# Patient Record
Sex: Male | Born: 1960 | Race: Black or African American | Hispanic: No | Marital: Single | Smoking: Never smoker
Health system: Southern US, Community
[De-identification: ages and names within clinical notes are randomized; demographics above are authoritative.]

## PROBLEM LIST (undated history)

## (undated) DIAGNOSIS — G629 Polyneuropathy, unspecified: Secondary | ICD-10-CM

## (undated) DIAGNOSIS — I10 Essential (primary) hypertension: Secondary | ICD-10-CM

---

## 2016-05-10 DIAGNOSIS — G4733 Obstructive sleep apnea (adult) (pediatric): Secondary | ICD-10-CM | POA: Insufficient documentation

## 2016-05-11 ENCOUNTER — Emergency Department (HOSPITAL_BASED_OUTPATIENT_CLINIC_OR_DEPARTMENT_OTHER)
Admission: EM | Admit: 2016-05-11 | Discharge: 2016-05-11 | Disposition: A | Discharge status: Home / Self Care | Payer: Self-pay | Attending: Emergency Medicine | Admitting: Emergency Medicine

## 2016-05-11 ENCOUNTER — Emergency Department (HOSPITAL_BASED_OUTPATIENT_CLINIC_OR_DEPARTMENT_OTHER): Payer: Self-pay

## 2016-05-11 ENCOUNTER — Encounter (HOSPITAL_BASED_OUTPATIENT_CLINIC_OR_DEPARTMENT_OTHER): Payer: Self-pay | Admitting: Emergency Medicine

## 2016-05-11 DIAGNOSIS — G4733 Obstructive sleep apnea (adult) (pediatric): Secondary | ICD-10-CM

## 2016-05-11 NOTE — ED Provider Notes (Signed)
[facility] Provider Note   CSN: [id number] Arrival date & time: 05/10/16  2353     History   Chief Complaint Chief Complaint  Patient presents with  . Shortness of Breath    HPI Gerald Simmons is a 55 y.o. male no sig PMH. Here with worsening SOB over the past several years that has gotten worse.  Patient states it is worse at night and esp when laying flat on his back.  His partner states he will often jump in bed. He states he will wake up in the middle of the night gasping for air.  In seconds, he will be back to normal.  Now even if he lays on his side, the same thing happens. He denies any pain during those times.  During the day, patient is very tired and does not feel well rested.  He has not seen a PCP for this. There are no further complaints.  10 Systems reviewed and are negative for acute change except as noted in the HPI.   HPI  History reviewed. No pertinent past medical history.  There are no active problems to display for this patient.   History reviewed. No pertinent surgical history.     Home Medications    Prior to Admission medications   Not on File    Family History History reviewed. No pertinent family history.  Social History Social History  Substance Use Topics  . Smoking status: Never Smoker  . Smokeless tobacco: Never Used  . Alcohol use Yes     Comment: occ     Allergies   Review of patient's allergies indicates no known allergies.   Review of Systems Review of Systems   Physical Exam Updated Vital Signs BP 172/74 (BP Location: Right Arm)   Pulse 62   Temp 98.2 F (36.8 C) (Oral)   Resp 18   Ht 5\' 6"  (1.676 m)   Wt 182 lb (82.6 kg)   SpO2 100%   BMI 29.38 kg/m   Physical Exam  Constitutional: He is oriented to person, place, and time. Vital signs are normal. He appears well-developed and well-nourished.  Non-toxic appearance. He does not appear ill. No distress.  HENT:  Head: Normocephalic and atraumatic.    Nose: Nose normal.  Mouth/Throat: Oropharynx is clear and moist. No oropharyngeal exudate.  Eyes: Conjunctivae and EOM are normal. Pupils are equal, round, and reactive to light. No scleral icterus.  Neck: Normal range of motion. Neck supple. No tracheal deviation, no edema, no erythema and normal range of motion present. No thyroid mass and no thyromegaly present.  Cardiovascular: Normal rate, regular rhythm, S1 normal, S2 normal, normal heart sounds, intact distal pulses and normal pulses.  Exam reveals no gallop and no friction rub.   No murmur heard. Pulmonary/Chest: Effort normal and breath sounds normal. No respiratory distress. He has no wheezes. He has no rhonchi. He has no rales.  Abdominal: Soft. Normal appearance and bowel sounds are normal. He exhibits no distension, no ascites and no mass. There is no hepatosplenomegaly. There is no tenderness. There is no rebound, no guarding and no CVA tenderness.  Musculoskeletal: Normal range of motion. He exhibits no edema or tenderness.  Lymphadenopathy:    He has no cervical adenopathy.  Neurological: He is alert and oriented to person, place, and time. He has normal strength. No cranial nerve deficit or sensory deficit.  Skin: Skin is warm, dry and intact. No petechiae and no rash noted. He is not diaphoretic.  No erythema. No pallor.  Nursing note and vitals reviewed.    ED Treatments / Results  Labs (all labs ordered are listed, but only abnormal results are displayed) Labs Reviewed - No data to display  EKG  EKG Interpretation  Date/Time:  Sunday May 11 2016 03:13:06 EDT Ventricular Rate:  58 PR Interval:    QRS Duration: 83 QT Interval:  416 QTC Calculation: 409 R Axis:   43 Text Interpretation:  Sinus rhythm RSR' in V1 or V2, probably normal variant No old tracing to compare Confirmed by Erroll Luna [phone]) on 05/11/2016 3:21:19 AM       Radiology Dg Chest 2 View  Result Date: 05/11/2016 CLINICAL DATA:   Shortness of breath. Chronic gasping for air while sleeping at night. Acutely worse over 2 days. Palpitations. Previous smoker. EXAM: CHEST  2 VIEW COMPARISON:  None. FINDINGS: The heart size and mediastinal contours are within normal limits. Both lungs are clear. The visualized skeletal structures are unremarkable. IMPRESSION: No active cardiopulmonary disease. Electronically Signed   By: Burman Nieves M.D.   On: 05/11/2016 03:32    Procedures Procedures (including critical care time)  Medications Ordered in ED Medications - No data to display   Initial Impression / Assessment and Plan / ED Course  I have reviewed the triage vital signs and the nursing notes.  Pertinent labs & imaging results that were available during my care of the patient were reviewed by me and considered in my medical decision making (see chart for details).  Clinical Course    Patient presents to the ED for SOB while sleeping. History is most consistent with OSA.  Will obtain EKG and CXR in the ED.  Education provided as well as need for PCP fu and outpt sleep study.     3:36 AM EKG is unremarkable.  CXR does not show acute pathology.  He demonstrates good understanding of the plan. He appears well and in NAD. VS remain within his normal limits and he is safe for DC.  Final Clinical Impressions(s) / ED Diagnoses   Final diagnoses:  None    New Prescriptions New Prescriptions   No medications on file     Tomasita Crumble, MD 05/11/16 [phone]

## 2016-05-11 NOTE — ED Triage Notes (Signed)
Patient states that when he is sleeping at night he falls asleep and wakes up gasping for breath. He states that has been going on for a while, but last night when he was asleep it scared him

## 2016-05-23 ENCOUNTER — Ambulatory Visit: Payer: Self-pay | Attending: Internal Medicine | Admitting: Physician Assistant

## 2016-05-23 VITALS — BP 144/84 | HR 67 | Temp 98.0°F | Resp 16 | Wt 187.0 lb

## 2016-05-23 DIAGNOSIS — Z23 Encounter for immunization: Secondary | ICD-10-CM

## 2016-05-23 DIAGNOSIS — R030 Elevated blood-pressure reading, without diagnosis of hypertension: Secondary | ICD-10-CM | POA: Insufficient documentation

## 2016-05-23 DIAGNOSIS — G479 Sleep disorder, unspecified: Secondary | ICD-10-CM | POA: Insufficient documentation

## 2016-05-23 DIAGNOSIS — IMO0001 Reserved for inherently not codable concepts without codable children: Secondary | ICD-10-CM

## 2016-05-23 DIAGNOSIS — Z87891 Personal history of nicotine dependence: Secondary | ICD-10-CM | POA: Insufficient documentation

## 2016-05-23 DIAGNOSIS — R5383 Other fatigue: Secondary | ICD-10-CM | POA: Insufficient documentation

## 2016-05-23 LAB — CBC WITH DIFFERENTIAL/PLATELET
BASOS ABS: 66 {cells}/uL (ref 0–200)
Basophils Relative: 1 %
EOS ABS: 132 {cells}/uL (ref 15–500)
Eosinophils Relative: 2 %
HEMATOCRIT: 38.7 % (ref 38.5–50.0)
Hemoglobin: 13.4 g/dL (ref 13.2–17.1)
LYMPHS PCT: 33 %
Lymphs Abs: 2178 cells/uL (ref 850–3900)
MCH: 29.4 pg (ref 27.0–33.0)
MCHC: 34.6 g/dL (ref 32.0–36.0)
MCV: 84.9 fL (ref 80.0–100.0)
MONO ABS: 726 {cells}/uL (ref 200–950)
MPV: 10 fL (ref 7.5–12.5)
Monocytes Relative: 11 %
NEUTROS PCT: 53 %
Neutro Abs: 3498 cells/uL (ref 1500–7800)
Platelets: 354 10*3/uL (ref 140–400)
RBC: 4.56 MIL/uL (ref 4.20–5.80)
RDW: 13.6 % (ref 11.0–15.0)
WBC: 6.6 10*3/uL (ref 3.8–10.8)

## 2016-05-23 LAB — COMPREHENSIVE METABOLIC PANEL
ALT: 31 U/L (ref 9–46)
AST: 28 U/L (ref 10–35)
Albumin: 4.3 g/dL (ref 3.6–5.1)
Alkaline Phosphatase: 51 U/L (ref 40–115)
BILIRUBIN TOTAL: 0.7 mg/dL (ref 0.2–1.2)
BUN: 15 mg/dL (ref 7–25)
CO2: 20 mmol/L (ref 20–31)
Calcium: 9.1 mg/dL (ref 8.6–10.3)
Chloride: 105 mmol/L (ref 98–110)
Creat: 0.99 mg/dL (ref 0.70–1.33)
GLUCOSE: 92 mg/dL (ref 65–99)
POTASSIUM: 4.3 mmol/L (ref 3.5–5.3)
Sodium: 135 mmol/L (ref 135–146)
Total Protein: 7 g/dL (ref 6.1–8.1)

## 2016-05-23 LAB — TSH: TSH: 1.79 mIU/L (ref 0.40–4.50)

## 2016-05-23 NOTE — Progress Notes (Signed)
Patient ID: Gerald Simmons, male   DOB: 12-Aug-1961, 55 y.o.   MRN: [id number]   Gerald Simmons, is a 55 y.o. male  Gerald Simmons  Gerald Simmons  DOB - 07/06/1961  Subjective:  Chief Complaint and HPI: Gerald Simmons is a 55 y.o. male here today to establish care and for a follow up visit from the ED where he was seen for SOB during sleep.  He tells me that he has had trouble with sleep for as long as 15 years where he will awaken from sleep choking for air.  EKG and CXR was done in the ED.  Patient quit smoking about 6 months ago.  Denies CP, DOE, dizziness.  Does c/o daytime somnolence and never feels well-rested.  He does not exercise regularly.  He has never been formerly diagnosed with htn, but does remember his "top BP number" being high at times.  He denies frequent headaches, dizziness, or vision changes.  He has not had any labs done in many years.    ED/Hospital notes/EKG/CXR reviewed.     ROS:   Constitutional:  No f/c, No night sweats, No unexplained weight loss. EENT:  No vision changes, No blurry vision, No hearing changes. No mouth, throat, or ear problems.  Respiratory: No cough, +SOB at night awakens him from sleep Cardiac: No CP, no palpitations GI:  No abd pain, No N/V/D. GU: No Urinary s/sx Musculoskeletal: No joint pain Neuro: No headache, no dizziness, no motor weakness.  Skin: No rash Endocrine:  No polydipsia. No polyuria.  Psych: Denies SI/HI  No problems updated.  ALLERGIES: No Known Allergies  PAST MEDICAL HISTORY: No past medical history on file.  MEDICATIONS AT HOME: Prior to Admission medications   Not on File     Objective:  EXAM:   Vitals:   05/23/16 0913  BP: (!) 144/84  Pulse: 67  Resp: 16  Temp: 98 F (36.7 C)  TempSrc: Oral  SpO2: 95%  Weight: 187 lb (84.8 kg)    General appearance : A&OX3. NAD. Non-toxic-appearing.  Appears fit.   HEENT: Atraumatic and Normocephalic.  PERRLA. EOM intact.  TM clear B.  +nasal inflammation that  appears chronic Mouth-MMM, post pharynx WNL w/o erythema, No PND.  Mallampati 2 Neck: supple, no JVD. No cervical lymphadenopathy. No thyromegaly Chest/Lungs:  Breathing-non-labored, Good air entry bilaterally, breath sounds normal without rales, rhonchi, or wheezing  CVS: S1 S2 regular, no murmurs, gallops, rubs  Extremities: Bilateral Lower Ext shows no edema, both legs are warm to touch with = pulse throughout Neurology:  CN II-XII grossly intact, Non focal.   Psych:  TP linear. J/I WNL. Normal speech. Appropriate eye contact and affect.  Skin:  No Rash  Data Review No results found for: HGBA1C   Assessment & Plan   1. Other fatigue - TSH - CBC with Differential/Platelet  2. Sleep disturbance - Nocturnal polysomnography (NPSG); Future  3. Elevated BP Concern for sleep apnea which may be contributing to abnormal BP. Although he is not extremely overweight, 10-15 pound weight loss might be helpful.   Encouraged healthy diet, exercise, limit salt intake.  Check BP OOO 2-3X/week and record and bring in to next visit.   - Comprehensive metabolic panel  4. Needs flu shot - Flu Vaccine QUAD 36+ mos PF IM (Fluarix & Fluzone Quad PF)  Patient have been counseled extensively about nutrition and exercise  Return in about 4 weeks (around 06/20/2016) for establish with PCP; f/up sleep study and elevated BP.  The patient  was given clear instructions to go to ER or return to medical center if symptoms don't improve, worsen or new problems develop. The patient verbalized understanding. The patient was told to call to get lab results if they haven't heard anything in the next week.     Georgian CoAngela Sister Carbone, PA-C [facility] [place], [place] [phone]   05/23/2016, 9:32 AM

## 2016-05-23 NOTE — Patient Instructions (Addendum)
Check BP out of the office 2-3X/ week and record  Sleep Apnea  Sleep apnea is a sleep disorder characterized by abnormal pauses in breathing while you sleep. When your breathing pauses, the level of oxygen in your blood decreases. This causes you to move out of deep sleep and into light sleep. As a result, your quality of sleep is poor, and the system that carries your blood throughout your body (cardiovascular system) experiences stress. If sleep apnea remains untreated, the following conditions can develop:  High blood pressure (hypertension).  Coronary artery disease.  Inability to achieve or maintain an erection (impotence).  Impairment of your thought process (cognitive dysfunction). There are three types of sleep apnea: 1. Obstructive sleep apnea--Pauses in breathing during sleep because of a blocked airway. 2. Central sleep apnea--Pauses in breathing during sleep because the area of the brain that controls your breathing does not send the correct signals to the muscles that control breathing. 3. Mixed sleep apnea--A combination of both obstructive and central sleep apnea. RISK FACTORS The following risk factors can increase your risk of developing sleep apnea:  Being overweight.  Smoking.  Having narrow passages in your nose and throat.  Being of older age.  Being male.  Alcohol use.  Sedative and tranquilizer use.  Ethnicity. Among individuals younger than 35 years, African Americans are at increased risk of sleep apnea. SYMPTOMS   Difficulty staying asleep.  Daytime sleepiness and fatigue.  Loss of energy.  Irritability.  Loud, heavy snoring.  Morning headaches.  Trouble concentrating.  Forgetfulness.  Decreased interest in sex.  Unexplained sleepiness. DIAGNOSIS  In order to diagnose sleep apnea, your caregiver will perform a physical examination. A sleep study done in the comfort of your own home may be appropriate if you are otherwise healthy. Your  caregiver may also recommend that you spend the night in a sleep lab. In the sleep lab, several monitors record information about your heart, lungs, and brain while you sleep. Your leg and arm movements and blood oxygen level are also recorded. TREATMENT The following actions may help to resolve mild sleep apnea:  Sleeping on your side.   Using a decongestant if you have nasal congestion.   Avoiding the use of depressants, including alcohol, sedatives, and narcotics.   Losing weight and modifying your diet if you are overweight. There also are devices and treatments to help open your airway:  Oral appliances. These are custom-made mouthpieces that shift your lower jaw forward and slightly open your bite. This opens your airway.  Devices that create positive airway pressure. This positive pressure "splints" your airway open to help you breathe better during sleep. The following devices create positive airway pressure:  Continuous positive airway pressure (CPAP) device. The CPAP device creates a continuous level of air pressure with an air pump. The air is delivered to your airway through a mask while you sleep. This continuous pressure keeps your airway open.  Nasal expiratory positive airway pressure (EPAP) device. The EPAP device creates positive air pressure as you exhale. The device consists of single-use valves, which are inserted into each nostril and held in place by adhesive. The valves create very little resistance when you inhale but create much more resistance when you exhale. That increased resistance creates the positive airway pressure. This positive pressure while you exhale keeps your airway open, making it easier to breath when you inhale again.  Bilevel positive airway pressure (BPAP) device. The BPAP device is used mainly in patients with central  sleep apnea. This device is similar to the CPAP device because it also uses an air pump to deliver continuous air pressure through  a mask. However, with the BPAP machine, the pressure is set at two different levels. The pressure when you exhale is lower than the pressure when you inhale.  Surgery. Typically, surgery is only done if you cannot comply with less invasive treatments or if the less invasive treatments do not improve your condition. Surgery involves removing excess tissue in your airway to create a wider passage way.   This information is not intended to replace advice given to you by your health care provider. Make sure you discuss any questions you have with your health care provider.   Document Released: 08/15/2002 Document Revised: 09/15/2014 Document Reviewed: 01/01/2012 Elsevier Interactive Patient Education 2016 Elsevier Inc.    Influenza Virus Vaccine injection (Fluarix) What is this medicine? INFLUENZA VIRUS VACCINE (in floo EN zuh VAHY ruhs vak SEEN) helps to reduce the risk of getting influenza also known as the flu. This medicine may be used for other purposes; ask your health care provider or pharmacist if you have questions. What should I tell my health care provider before I take this medicine? They need to know if you have any of these conditions: -bleeding disorder like hemophilia -fever or infection -Guillain-Barre syndrome or other neurological problems -immune system problems -infection with the human immunodeficiency virus (HIV) or AIDS -low blood platelet counts -multiple sclerosis -an unusual or allergic reaction to influenza virus vaccine, eggs, chicken proteins, latex, gentamicin, other medicines, foods, dyes or preservatives -pregnant or trying to get pregnant -breast-feeding How should I use this medicine? This vaccine is for injection into a muscle. It is given by a health care professional. A copy of Vaccine Information Statements will be given before each vaccination. Read this sheet carefully each time. The sheet may change frequently. Talk to your pediatrician regarding  the use of this medicine in children. Special care may be needed. Overdosage: If you think you have taken too much of this medicine contact a poison control center or emergency room at once. NOTE: This medicine is only for you. Do not share this medicine with others. What if I miss a dose? This does not apply. What may interact with this medicine? -chemotherapy or radiation therapy -medicines that lower your immune system like etanercept, anakinra, infliximab, and adalimumab -medicines that treat or prevent blood clots like warfarin -phenytoin -steroid medicines like prednisone or cortisone -theophylline -vaccines This list may not describe all possible interactions. Give your health care provider a list of all the medicines, herbs, non-prescription drugs, or dietary supplements you use. Also tell them if you smoke, drink alcohol, or use illegal drugs. Some items may interact with your medicine. What should I watch for while using this medicine? Report any side effects that do not go away within 3 days to your doctor or health care professional. Call your health care provider if any unusual symptoms occur within 6 weeks of receiving this vaccine. You may still catch the flu, but the illness is not usually as bad. You cannot get the flu from the vaccine. The vaccine will not protect against colds or other illnesses that may cause fever. The vaccine is needed every year. What side effects may I notice from receiving this medicine? Side effects that you should report to your doctor or health care professional as soon as possible: -allergic reactions like skin rash, itching or hives, swelling of the face, lips,  or tongue Side effects that usually do not require medical attention (report to your doctor or health care professional if they continue or are bothersome): -fever -headache -muscle aches and pains -pain, tenderness, redness, or swelling at site where injected -weak or tired This list  may not describe all possible side effects. Call your doctor for medical advice about side effects. You may report side effects to FDA at 1-800-FDA-1088. Where should I keep my medicine? This vaccine is only given in a clinic, pharmacy, doctor's office, or other health care setting and will not be stored at home. NOTE: This sheet is a summary. It may not cover all possible information. If you have questions about this medicine, talk to your doctor, pharmacist, or health care provider.    2016, Elsevier/Gold Standard. (2008-03-22 09:30:40)

## 2016-05-23 NOTE — Progress Notes (Signed)
Pt is in the office today for a ED follow up PT states he is not in any pain

## 2016-05-30 ENCOUNTER — Telehealth: Payer: Self-pay

## 2016-05-30 NOTE — Telephone Encounter (Signed)
Contacted pt to go over lab results pt didn't answer was unable to lvm 

## 2016-06-04 ENCOUNTER — Telehealth: Payer: Self-pay

## 2016-06-04 NOTE — Telephone Encounter (Signed)
Contacted pt to go over lab results pt is aware and doesn't have any questions or concerns 

## 2016-06-20 ENCOUNTER — Ambulatory Visit: Payer: Self-pay | Admitting: Family Medicine

## 2016-07-16 ENCOUNTER — Ambulatory Visit: Payer: Self-pay | Attending: Family Medicine | Admitting: Family Medicine

## 2016-07-16 ENCOUNTER — Encounter: Payer: Self-pay | Admitting: Family Medicine

## 2016-07-16 VITALS — BP 158/78 | HR 53 | Temp 97.8°F | Resp 16 | Ht 66.0 in | Wt 187.0 lb

## 2016-07-16 DIAGNOSIS — I10 Essential (primary) hypertension: Secondary | ICD-10-CM

## 2016-07-16 DIAGNOSIS — H579 Unspecified disorder of eye and adnexa: Secondary | ICD-10-CM

## 2016-07-16 DIAGNOSIS — R400 Somnolence: Secondary | ICD-10-CM | POA: Insufficient documentation

## 2016-07-16 DIAGNOSIS — R5383 Other fatigue: Secondary | ICD-10-CM | POA: Insufficient documentation

## 2016-07-16 MED ORDER — LISINOPRIL 5 MG PO TABS: 5.0000 mg | ORAL_TABLET | Freq: Every day | ORAL | 3 refills | Status: DC

## 2016-07-16 NOTE — Patient Instructions (Signed)
Hypertension Hypertension, commonly called high blood pressure, is when the force of blood pumping through your arteries is too strong. Your arteries are the blood vessels that carry blood from your heart throughout your body. A blood pressure reading consists of a higher number over a lower number, such as 110/72. The higher number (systolic) is the pressure inside your arteries when your heart pumps. The lower number (diastolic) is the pressure inside your arteries when your heart relaxes. Ideally you want your blood pressure below 120/80. Hypertension forces your heart to work harder to pump blood. Your arteries may become narrow or stiff. Having untreated or uncontrolled hypertension can cause heart attack, stroke, kidney disease, and other problems. RISK FACTORS Some risk factors for high blood pressure are controllable. Others are not.  Risk factors you cannot control include:   Race. You may be at higher risk if you are African American.  Age. Risk increases with age.  Gender. Men are at higher risk than women before age 45 years. After age 65, women are at higher risk than men. Risk factors you can control include:  Not getting enough exercise or physical activity.  Being overweight.  Getting too much fat, sugar, calories, or salt in your diet.  Drinking too much alcohol. SIGNS AND SYMPTOMS Hypertension does not usually cause signs or symptoms. Extremely high blood pressure (hypertensive crisis) may cause headache, anxiety, shortness of breath, and nosebleed. DIAGNOSIS To check if you have hypertension, your health care provider will measure your blood pressure while you are seated, with your arm held at the level of your heart. It should be measured at least twice using the same arm. Certain conditions can cause a difference in blood pressure between your right and left arms. A blood pressure reading that is higher than normal on one occasion does not mean that you need treatment. If  it is not clear whether you have high blood pressure, you may be asked to return on a different day to have your blood pressure checked again. Or, you may be asked to monitor your blood pressure at home for 1 or more weeks. TREATMENT Treating high blood pressure includes making lifestyle changes and possibly taking medicine. Living a healthy lifestyle can help lower high blood pressure. You may need to change some of your habits. Lifestyle changes may include:  Following the DASH diet. This diet is high in fruits, vegetables, and whole grains. It is low in salt, red meat, and added sugars.  Keep your sodium intake below 2,300 mg per day.  Getting at least 30-45 minutes of aerobic exercise at least 4 times per week.  Losing weight if necessary.  Not smoking.  Limiting alcoholic beverages.  Learning ways to reduce stress. Your health care provider may prescribe medicine if lifestyle changes are not enough to get your blood pressure under control, and if one of the following is true:  You are 18-59 years of age and your systolic blood pressure is above 140.  You are 60 years of age or older, and your systolic blood pressure is above 150.  Your diastolic blood pressure is above 90.  You have diabetes, and your systolic blood pressure is over 140 or your diastolic blood pressure is over 90.  You have kidney disease and your blood pressure is above 140/90.  You have heart disease and your blood pressure is above 140/90. Your personal target blood pressure may vary depending on your medical conditions, your age, and other factors. HOME CARE INSTRUCTIONS    Have your blood pressure rechecked as directed by your health care provider.   Take medicines only as directed by your health care provider. Follow the directions carefully. Blood pressure medicines must be taken as prescribed. The medicine does not work as well when you skip doses. Skipping doses also puts you at risk for  problems.  Do not smoke.   Monitor your blood pressure at home as directed by your health care provider. SEEK MEDICAL CARE IF:   You think you are having a reaction to medicines taken.  You have recurrent headaches or feel dizzy.  You have swelling in your ankles.  You have trouble with your vision. SEEK IMMEDIATE MEDICAL CARE IF:  You develop a severe headache or confusion.  You have unusual weakness, numbness, or feel faint.  You have severe chest or abdominal pain.  You vomit repeatedly.  You have trouble breathing. MAKE SURE YOU:   Understand these instructions.  Will watch your condition.  Will get help right away if you are not doing well or get worse.   This information is not intended to replace advice given to you by your health care provider. Make sure you discuss any questions you have with your health care provider.   Document Released: 08/25/2005 Document Revised: 01/09/2015 Document Reviewed: 06/17/2013 Elsevier Interactive Patient Education 2016 Elsevier Inc.  

## 2016-07-16 NOTE — Progress Notes (Signed)
   Subjective:  Patient ID: Gerald Simmons, male    DOB: 12/15/1960  Age: 55 y.o. MRN: [id number]  CC: Hypertension   HPI Gerald Simmons presents For follow-up of symptoms suggestive of sleep apnea. Sleep study was ordered at his last visit which he is yet to have and he continues to complain of daytime somnolence and fatigue and snoring at night.  His blood pressure was elevated at his last office visit and asked health changes recommended however blood pressure is still elevated today. He denies chest pains or shortness of breath.  Also like to have his eyes examined as he is unable to see fine prints.  No outpatient prescriptions prior to visit.   No facility-administered medications prior to visit.     ROS Review of Systems  Constitutional: Positive for fatigue. Negative for activity change and appetite change.  HENT: Negative for sinus pressure and sore throat.   Eyes: Positive for visual disturbance.  Respiratory: Negative for chest tightness, shortness of breath and wheezing.   Cardiovascular: Negative for chest pain and palpitations.  Gastrointestinal: Negative for abdominal distention, abdominal pain and constipation.  Genitourinary: Negative.   Musculoskeletal: Negative.   Psychiatric/Behavioral: Negative for behavioral problems and dysphoric mood.    Objective:  BP (!) 158/78   Pulse (!) 53   Temp 97.8 F (36.6 C) (Oral)   Resp 16   Ht 5\' 6"  (1.676 m)   Wt 187 lb (84.8 kg)   SpO2 97%   BMI 30.18 kg/m   BP/Weight 07/16/2016 05/23/2016 05/11/2016  Systolic BP 158 144 160  Diastolic BP 78 84 81  Wt. (Lbs) 187 187 182  BMI 30.18 30.18 29.38      Physical Exam  Constitutional: He is oriented to person, place, and time. He appears well-developed and well-nourished.  Cardiovascular: Normal rate, normal heart sounds and intact distal pulses.   No murmur heard. Pulmonary/Chest: Effort normal and breath sounds normal. He has no wheezes. He has no rales. He exhibits no  tenderness.  Abdominal: Soft. Bowel sounds are normal. He exhibits no distension and no mass. There is no tenderness.  Musculoskeletal: Normal range of motion.  Neurological: He is alert and oriented to person, place, and time.     Assessment & Plan:   1. Essential hypertension Commenced on lisinopril Low-sodium diet  2. Visual complaint Patient has been provided with community resources for eye exams as he is unable to be referred to ophthalmology due to lack of medical coverage  Strongly advised to complete financial application for facilitation of sleep study.   Meds ordered this encounter  Medications  . lisinopril (PRINIVIL,ZESTRIL) 5 MG tablet    Sig: Take 1 tablet (5 mg total) by mouth daily.    Dispense:  30 tablet    Refill:  3    Follow-up: Return in about 6 weeks (around 08/27/2016) for Follow-up on sleep study.   Jaclyn ShaggyEnobong Amao MD

## 2016-07-16 NOTE — Progress Notes (Signed)
Patient here for BP and concern about sleep apnea was recently diagnosed. Also needs eye exam

## 2016-12-15 ENCOUNTER — Telehealth: Payer: Self-pay

## 2016-12-15 NOTE — Telephone Encounter (Signed)
Called to find out if pt wants colonoscopy scheduled 

## 2019-07-24 ENCOUNTER — Encounter (HOSPITAL_BASED_OUTPATIENT_CLINIC_OR_DEPARTMENT_OTHER): Payer: Self-pay

## 2019-07-24 ENCOUNTER — Other Ambulatory Visit: Payer: Self-pay

## 2019-07-24 ENCOUNTER — Emergency Department (HOSPITAL_BASED_OUTPATIENT_CLINIC_OR_DEPARTMENT_OTHER)
Admission: EM | Admit: 2019-07-24 | Discharge: 2019-07-24 | Disposition: A | Discharge status: Home / Self Care | Payer: Self-pay | Attending: Emergency Medicine | Admitting: Emergency Medicine

## 2019-07-24 DIAGNOSIS — Z79899 Other long term (current) drug therapy: Secondary | ICD-10-CM | POA: Insufficient documentation

## 2019-07-24 DIAGNOSIS — G6289 Other specified polyneuropathies: Secondary | ICD-10-CM | POA: Insufficient documentation

## 2019-07-24 DIAGNOSIS — I10 Essential (primary) hypertension: Secondary | ICD-10-CM | POA: Insufficient documentation

## 2019-07-24 HISTORY — DX: Essential (primary) hypertension: I10

## 2019-07-24 LAB — HEMOGLOBIN A1C
Hgb A1c MFr Bld: 6.1 % — ABNORMAL HIGH (ref 4.8–5.6)
Mean Plasma Glucose: 128.37 mg/dL

## 2019-07-24 LAB — BASIC METABOLIC PANEL
Anion gap: 9 (ref 5–15)
BUN: 13 mg/dL (ref 6–20)
CO2: 24 mmol/L (ref 22–32)
Calcium: 9.1 mg/dL (ref 8.9–10.3)
Chloride: 102 mmol/L (ref 98–111)
Creatinine, Ser: 1.03 mg/dL (ref 0.61–1.24)
GFR calc Af Amer: >60 mL/min (ref >60)
GFR calc non Af Amer: >60 mL/min (ref >60)
Glucose, Bld: 133 mg/dL — ABNORMAL HIGH (ref 70–99)
Potassium: 3.5 mmol/L (ref 3.5–5.1)
Sodium: 135 mmol/L (ref 135–145)

## 2019-07-24 LAB — RAPID HIV SCREEN (HIV 1/2 AB+AG)
HIV 1/2 Antibodies: NONREACTIVE
HIV-1 P24 Antigen - HIV24: NONREACTIVE

## 2019-07-24 LAB — MAGNESIUM: Magnesium: 2.2 mg/dL (ref 1.7–2.4)

## 2019-07-24 LAB — VITAMIN B12: Vitamin B-12: 586 pg/mL (ref 180–914)

## 2019-07-24 MED ORDER — LISINOPRIL 5 MG PO TABS: 5.0000 mg | ORAL_TABLET | Freq: Every day | ORAL | 0 refills | Status: AC

## 2019-07-24 MED ORDER — LISINOPRIL 10 MG PO TABS
10.0000 mg | ORAL_TABLET | Freq: Once | ORAL | Status: AC
  Administered 2019-07-24: 10 mg via ORAL
  Filled 2019-07-24: qty 1

## 2019-07-24 NOTE — ED Notes (Signed)
ED Provider at bedside. 

## 2019-07-24 NOTE — ED Triage Notes (Signed)
Pt reports that for the past month he has been having numbness, burning and cramps in his hands and lower legs/feet. Pt reports that while this has been ongoing for a month he noticed that it is becoming more frequent. Pt. Reports using tylenol and marijuana for the pain.

## 2019-07-24 NOTE — ED Provider Notes (Signed)
[facility] EMERGENCY DEPARTMENT Provider Note   CSN: [id number] Arrival date & time: 07/24/19  0744     History   Chief Complaint Chief Complaint  Patient presents with  . Numbness    HPI Gerald Simmons is a 58 y.o. male.     HPI  This very pleasant 58 year old male presents with a complaint of a tingling and painful sensation in the tips of his fingers and toes, seems to be spreading up his body.  He reports this has been present for approximately 1 month, seems to get worse, is there for the most part all day long.  It is not associated with any changes in vision, no chest pain coughing or shortness of breath.  He reports he has not had his blood pressure medication in quite some time and is worried that he might be a diabetic and have HIV due to an exposure couple of years ago.  He has not been tested for that and after review of the medical record he has not had frequent evaluations or recent evaluations in the emergency department.  The patient does report that he drinks approximately 7 or 8 beers per day, has a normal diet otherwise  Past Medical History:  Diagnosis Date  . Hypertension    pt reprots that he does not take medication for this    Patient Active Problem List   Diagnosis Date Noted  . Essential hypertension 07/16/2016  . Fatigue 07/16/2016    History reviewed. No pertinent surgical history.      Home Medications    Prior to Admission medications   Medication Sig Start Date End Date Taking? Authorizing Provider  lisinopril (ZESTRIL) 5 MG tablet Take 1 tablet (5 mg total) by mouth daily. 07/24/19 08/23/19  Eber Hong, MD    Family History History reviewed. No pertinent family history.  Social History Social History   Tobacco Use  . Smoking status: Never Smoker  . Smokeless tobacco: Never Used  Substance Use Topics  . Alcohol use: Yes    Comment: occ  . Drug use: Yes    Types: Marijuana     Allergies   Patient has no  known allergies.   Review of Systems Review of Systems  Constitutional: Negative for chills and fever.  HENT: Negative for sore throat.   Eyes: Negative for visual disturbance.  Respiratory: Negative for cough and shortness of breath.   Cardiovascular: Negative for chest pain.  Gastrointestinal: Negative for abdominal pain, diarrhea, nausea and vomiting.  Genitourinary: Negative for dysuria and frequency.  Musculoskeletal: Negative for back pain and neck pain.  Skin: Negative for rash.  Neurological: Positive for numbness ( Tips of fingers and toes). Negative for weakness and headaches.       Positive for paresthesias  Hematological: Negative for adenopathy.  Psychiatric/Behavioral: Negative for behavioral problems.     Physical Exam Updated Vital Signs BP 136/64   Pulse 61   Temp 97.8 F (36.6 C) (Oral)   Resp 15   Ht 1.676 m (5\' 6" )   Wt 87.5 kg   SpO2 98%   BMI 31.15 kg/m   Physical Exam Constitutional:      Comments: Well-appearing, no distress  HENT:     Head:     Comments: Normal-appearing head without signs of trauma, small lymph node present in the left periauricular area, minimal tenderness, no redness    Nose:     Comments: Clear nasal passages without drainage    Mouth/Throat:  Comments: Clear oropharynx with normal mucous membranes Eyes:     Comments: No jaundice, normal-appearing pupils  Neck:     Comments: Supple neck with no lymphadenopathy Cardiovascular:     Comments: Clear heart and lung sounds without irregularity or murmurs Pulmonary:     Comments: Clear lung sounds, no wheezing rhonchi or rales, speaks in full sentences, no increased work of breathing Abdominal:     Comments: No abdominal tenderness or masses  Musculoskeletal:     Comments: There is no edema of the legs, no deformity of the 4 extremities, normal appearing hands and digits  Skin:    Comments: The skin is intact without rashes bruising hematomas lacerations or other  abnormalities  Neurological:     Comments: Gait and speech are normal, cranial nerves III through XII are normal with normal facial symmetry, normal visual fields and gross visual acuity.  He has normal strength in all 4 extremities, normal reflexes at the patellar tendons bilaterally, he has a slight paresthesia sensation to palpation over the hands and feet especially over the ends of the fingers.  His gait is normal      ED Treatments / Results  Labs (all labs ordered are listed, but only abnormal results are displayed) Labs Reviewed  BASIC METABOLIC PANEL - Abnormal; Notable for the following components:      Result Value   Glucose, Bld 133 (*)    All other components within normal limits  MAGNESIUM  RAPID HIV SCREEN (HIV 1/2 AB+AG)  VITAMIN B12  HEMOGLOBIN A1C    EKG None  Radiology No results found.  Procedures Procedures (including critical care time)  Medications Ordered in ED Medications  lisinopril (ZESTRIL) tablet 10 mg (10 mg Oral Given 07/24/19 9326)     Initial Impression / Assessment and Plan / ED Course  I have reviewed the triage vital signs and the nursing notes.  Pertinent labs & imaging results that were available during my care of the patient were reviewed by me and considered in my medical decision making (see chart for details).  Clinical Course as of Jul 23 901  Sun Jul 24, 2019  0857 Magnesium and basic metabolic panel are both normal, blood pressure medicines given, the patient will be discharged home with medications and to follow-up with a family doctor.  Hemoglobin A1c, vitamin B12 and the rapid HIV test are pending, the patient is aware that he will need to follow-up these results   [BM]    Clinical Course User Index [BM] Noemi Chapel, MD      The patient expresses some concern for HIV as well as diabetes, he has not been taking his blood pressure medications.  We will check an A1c, HIV, vitamin B12 level and check a metabolic panel  to rule out hypokalemia, hypomagnesemia and hypocalcemia.  He will be restarted on his blood pressure medications and given the first dose here.  Otherwise his vital signs are unremarkable and the patient is willing to follow-up in the outpatient setting  Final Clinical Impressions(s) / ED Diagnoses   Final diagnoses:  Other polyneuropathy    ED Discharge Orders         Ordered    lisinopril (ZESTRIL) 5 MG tablet  Daily     07/24/19 0858           Noemi Chapel, MD 07/24/19 [phone]

## 2019-07-24 NOTE — Discharge Instructions (Addendum)
Your testing today shows that you likely do not have diabetes however the definitive test does not come back and will need to be followed up by your doctor.  Additionally the B12 level and your HIV test will need to be followed up by you and your doctor.  If you do not have a doctor please see the attached follow-up instructions Please start taking B12 as a supplement, you can find this at your local pharmacy Drink plenty of clear liquids and try to avoid alcohol as increased amounts of alcohol can cause the same syndrome. I have prescribed her blood pressure medication, please start taking it once a day, lisinopril 5 mg a day.   Come back to the emergency department for any increased or worsening symptoms.

## 2020-05-10 ENCOUNTER — Emergency Department (HOSPITAL_BASED_OUTPATIENT_CLINIC_OR_DEPARTMENT_OTHER): Payer: Self-pay

## 2020-05-10 ENCOUNTER — Encounter (HOSPITAL_BASED_OUTPATIENT_CLINIC_OR_DEPARTMENT_OTHER): Payer: Self-pay | Admitting: Emergency Medicine

## 2020-05-10 ENCOUNTER — Emergency Department (HOSPITAL_BASED_OUTPATIENT_CLINIC_OR_DEPARTMENT_OTHER)
Admission: EM | Admit: 2020-05-10 | Discharge: 2020-05-10 | Disposition: A | Discharge status: Home / Self Care | Payer: Self-pay | Attending: Emergency Medicine | Admitting: Emergency Medicine

## 2020-05-10 ENCOUNTER — Other Ambulatory Visit: Payer: Self-pay

## 2020-05-10 DIAGNOSIS — L03116 Cellulitis of left lower limb: Secondary | ICD-10-CM | POA: Insufficient documentation

## 2020-05-10 DIAGNOSIS — I10 Essential (primary) hypertension: Secondary | ICD-10-CM | POA: Insufficient documentation

## 2020-05-10 HISTORY — DX: Polyneuropathy, unspecified: G62.9

## 2020-05-10 MED ORDER — DOXYCYCLINE HYCLATE 100 MG PO TABS
100.0000 mg | ORAL_TABLET | Freq: Once | ORAL | Status: AC
  Administered 2020-05-10: 100 mg via ORAL
  Filled 2020-05-10: qty 1

## 2020-05-10 MED ORDER — DOXYCYCLINE HYCLATE 100 MG PO CAPS
100.0000 mg | ORAL_CAPSULE | Freq: Two times a day (BID) | ORAL | 0 refills | Status: AC
Start: 2020-05-10 — End: ?

## 2020-05-10 MED ORDER — ACETAMINOPHEN 325 MG PO TABS
650.0000 mg | ORAL_TABLET | Freq: Once | ORAL | Status: AC
  Administered 2020-05-10: 650 mg via ORAL
  Filled 2020-05-10: qty 2

## 2020-05-10 NOTE — ED Triage Notes (Signed)
Patient presents with complaints of left foot pain and swelling; redness noted to top of left foot; states onset yesterday; denies any known injury.

## 2020-05-10 NOTE — Discharge Instructions (Signed)
If you develop fever, new or worsening swelling or redness or streaking, new or worsening pain, or any other new/concerning symptoms then return to the ER for evaluation.

## 2020-05-10 NOTE — ED Provider Notes (Signed)
[facility] EMERGENCY DEPARTMENT Provider Note   CSN: [id number] Arrival date & time: 05/10/20  0601     History Chief Complaint  Patient presents with  . Foot Swelling    Gerald Simmons is a 59 y.o. male.  HPI 59 year old male presents with left foot pain and swelling.  Started about 2 days ago.  There was no specific injury that occurred.  He has not had any fevers.  It is painful but not severely so.  He rates the pain is a 7 out of 10. No prior history of gout. No severe sensitivity to light touch. Patient is a diabetic. No calf pain/tenderness.   Past Medical History:  Diagnosis Date  . Hypertension    pt reprots that he does not take medication for this  . Neuropathy     Patient Active Problem List   Diagnosis Date Noted  . Essential hypertension 07/16/2016  . Fatigue 07/16/2016    History reviewed. No pertinent surgical history.     History reviewed. No pertinent family history.  Social History   Tobacco Use  . Smoking status: Never Smoker  . Smokeless tobacco: Never Used  Substance Use Topics  . Alcohol use: Yes    Comment: occ  . Drug use: Yes    Types: Marijuana    Home Medications Prior to Admission medications   Medication Sig Start Date End Date Taking? Authorizing Provider  doxycycline (VIBRAMYCIN) 100 MG capsule Take 1 capsule (100 mg total) by mouth 2 (two) times daily. One po bid x 7 days 05/10/20   Pricilla Loveless, MD  lisinopril (ZESTRIL) 5 MG tablet Take 1 tablet (5 mg total) by mouth daily. 07/24/19 08/23/19  Eber Hong, MD    Allergies    Patient has no known allergies.  Review of Systems   Review of Systems  Constitutional: Negative for fever.  Musculoskeletal: Positive for arthralgias and joint swelling.  Skin: Positive for color change.    Physical Exam Updated Vital Signs BP (!) 148/79 (BP Location: Right Arm)   Pulse 62   Temp 98.1 F (36.7 C) (Oral)   Resp 18   Ht 5\' 6"  (1.676 m)   Wt 87.5 kg   SpO2 98%    BMI 31.14 kg/m   Physical Exam Vitals and nursing note reviewed.  Constitutional:      Appearance: He is well-developed.  HENT:     Head: Normocephalic and atraumatic.     Right Ear: External ear normal.     Left Ear: External ear normal.     Nose: Nose normal.  Eyes:     General:        Right eye: No discharge.        Left eye: No discharge.  Cardiovascular:     Rate and Rhythm: Normal rate and regular rhythm.     Pulses:          Dorsalis pedis pulses are 2+ on the right side and 2+ on the left side.  Pulmonary:     Effort: Pulmonary effort is normal.  Abdominal:     General: There is no distension.  Musculoskeletal:     Cervical back: Neck supple.     Left lower leg: No swelling or tenderness.     Left ankle: No swelling. Normal range of motion.     Left foot: Swelling and tenderness present.       Legs:     Comments: In between 4th/5th digits there appears to be some  skin breakdown  Skin:    General: Skin is warm and dry.     Findings: Erythema present.  Neurological:     Mental Status: He is alert.  Psychiatric:        Mood and Affect: Mood is not anxious.     ED Results / Procedures / Treatments   Labs (all labs ordered are listed, but only abnormal results are displayed) Labs Reviewed - No data to display  EKG None  Radiology DG Foot Complete Left  Result Date: 05/10/2020 CLINICAL DATA:  Left foot pain and swelling since yesterday. EXAM: LEFT FOOT - COMPLETE 3+ VIEW COMPARISON:  None. FINDINGS: There is no evidence of fracture or dislocation. There is no evidence of arthropathy or other focal bone abnormality. Soft tissues are unremarkable. IMPRESSION: Negative. Electronically Signed   By: Kennith Center M.D.   On: 05/10/2020 07:33    Procedures Procedures (including critical care time)  Medications Ordered in ED Medications  acetaminophen (TYLENOL) tablet 650 mg (650 mg Oral Given 05/10/20 0725)  doxycycline (VIBRA-TABS) tablet 100 mg (100 mg Oral  Given 05/10/20 0725)    ED Course  I have reviewed the triage vital signs and the nursing notes.  Pertinent labs & imaging results that were available during my care of the patient were reviewed by me and considered in my medical decision making (see chart for details).    MDM Rules/Calculators/A&P                          My suspicion is this is cellulitis rather than gout.  Especially given the break in the skin seen between the fourth and fifth digits.  X-ray is unremarkable.  No obvious abscess.  Will give doxycycline and refer to podiatry. Final Clinical Impression(s) / ED Diagnoses Final diagnoses:  Cellulitis of left foot    Rx / DC Orders ED Discharge Orders         Ordered    doxycycline (VIBRAMYCIN) 100 MG capsule  2 times daily        05/10/20 4765           Pricilla Loveless, MD 05/10/20 [phone]

## 2021-02-14 ENCOUNTER — Emergency Department (HOSPITAL_BASED_OUTPATIENT_CLINIC_OR_DEPARTMENT_OTHER): Payer: No Typology Code available for payment source

## 2021-02-14 ENCOUNTER — Other Ambulatory Visit: Payer: Self-pay

## 2021-02-14 ENCOUNTER — Emergency Department (HOSPITAL_BASED_OUTPATIENT_CLINIC_OR_DEPARTMENT_OTHER)
Admission: EM | Admit: 2021-02-14 | Discharge: 2021-02-14 | Disposition: A | Discharge status: Home / Self Care | Payer: No Typology Code available for payment source | Attending: Emergency Medicine | Admitting: Emergency Medicine

## 2021-02-14 ENCOUNTER — Encounter (HOSPITAL_BASED_OUTPATIENT_CLINIC_OR_DEPARTMENT_OTHER): Payer: Self-pay | Admitting: *Deleted

## 2021-02-14 DIAGNOSIS — K5903 Drug induced constipation: Secondary | ICD-10-CM

## 2021-02-14 DIAGNOSIS — Z79899 Other long term (current) drug therapy: Secondary | ICD-10-CM | POA: Insufficient documentation

## 2021-02-14 DIAGNOSIS — I10 Essential (primary) hypertension: Secondary | ICD-10-CM | POA: Insufficient documentation

## 2021-02-14 DIAGNOSIS — K5900 Constipation, unspecified: Secondary | ICD-10-CM | POA: Diagnosis not present

## 2021-02-14 DIAGNOSIS — R109 Unspecified abdominal pain: Secondary | ICD-10-CM | POA: Diagnosis present

## 2021-02-14 MED ORDER — POLYETHYLENE GLYCOL 3350 17 GM/SCOOP PO POWD: 17.0000 g | Freq: Every day | ORAL | 0 refills | Status: AC

## 2021-02-14 NOTE — Discharge Instructions (Addendum)
It was pleasure seeing you today.  I am sorry you are having this abdominal pain.  We got an abdominal x-ray which shows that you have a considerable amount of stool in your bowels.  I did an exam and was unable to dislodge any of the stool.  I am going to send some MiraLAX to your pharmacy and I want you to take this as needed for the constipation.  You can take a cup and if you do not have results in 6 hours you can take another dose of MiraLAX and continue this until you have results.  If you have any worsening symptoms, nausea, vomiting, please be reevaluated.  I hope you have a wonderful afternoon!

## 2021-02-14 NOTE — ED Notes (Signed)
ED Provider at bedside. 

## 2021-02-14 NOTE — ED Provider Notes (Signed)
[facility] EMERGENCY DEPARTMENT Provider Note   CSN: [id number] Arrival date & time: 02/14/21  6720     History Chief Complaint  Patient presents with   Abdominal Pain    Gerald Simmons is a 60 y.o. male.  Patient reports the emergency department because he has had 1 week without having a bowel movement.  He reports approximately 7 days ago he was having pain from peripheral neuropathy and he has not prescribed any medications for this so he took a friend's Suboxone.  He reports that since that time he has not had a solid formed bowel movement.  He is gotten more and more distended.  Reports that he took a box of Gas-X over the week thinking that may help with the constipation.  Has tried 1 other medication but he cannot remember the name of it.  Has not tried any MiraLAX.  Attempted an at-home enema last night with little results.  He reports a fair amount of liquid and a small piece of stool but he feels like there is still a lot of stool left.  Denies any other symptoms such as nausea, vomiting, changes in urination, pain with urination, headaches, fever, chills.      Past Medical History:  Diagnosis Date   Hypertension    pt reprots that he does not take medication for this   Neuropathy     Patient Active Problem List   Diagnosis Date Noted   Essential hypertension 07/16/2016   Fatigue 07/16/2016    History reviewed. No pertinent surgical history.     History reviewed. No pertinent family history.  Social History   Tobacco Use   Smoking status: Never   Smokeless tobacco: Never  Substance Use Topics   Alcohol use: Yes    Comment: occ   Drug use: Yes    Types: Marijuana    Home Medications Prior to Admission medications   Medication Sig Start Date End Date Taking? Authorizing Provider  doxycycline (VIBRAMYCIN) 100 MG capsule Take 1 capsule (100 mg total) by mouth 2 (two) times daily. One po bid x 7 days 05/10/20   Pricilla Loveless, MD  lisinopril  (ZESTRIL) 5 MG tablet Take 1 tablet (5 mg total) by mouth daily. 07/24/19 08/23/19  Eber Hong, MD    Allergies    Patient has no known allergies.  Review of Systems   Review of Systems  Constitutional:  Negative for activity change, chills and fever.  HENT:  Negative for congestion and rhinorrhea.   Eyes:  Negative for visual disturbance.  Respiratory:  Negative for cough, shortness of breath and wheezing.   Cardiovascular:  Negative for chest pain.  Gastrointestinal:  Positive for abdominal distention, abdominal pain and constipation. Negative for blood in stool, nausea and vomiting.  Genitourinary:  Negative for decreased urine volume, difficulty urinating and dysuria.  Musculoskeletal:  Negative for arthralgias.  Neurological:  Negative for weakness and headaches.   Physical Exam Updated Vital Signs BP (!) 165/78 (BP Location: Left Arm)   Pulse (!) 58   Temp 98.4 F (36.9 C) (Oral)   Resp 20   Ht 5\' 7"  (1.702 m)   Wt 88 kg   SpO2 97%   BMI 30.38 kg/m   Physical Exam Constitutional:      General: He is not in acute distress.    Appearance: He is well-developed. He is not ill-appearing.  HENT:     Head: Normocephalic and atraumatic.     Mouth/Throat:  Mouth: Mucous membranes are moist.  Eyes:     Extraocular Movements: Extraocular movements intact.  Cardiovascular:     Rate and Rhythm: Normal rate and regular rhythm.     Heart sounds: No murmur heard. Pulmonary:     Effort: Pulmonary effort is normal.     Breath sounds: Normal breath sounds.  Abdominal:     General: Bowel sounds are increased. There is distension.     Palpations: Abdomen is rigid. There is no mass.     Tenderness: There is no abdominal tenderness.     Hernia: No hernia is present.  Genitourinary:    Rectum: Normal. No mass.     Comments: Rectal exam performed, unable to palpate any stool in the rectal vault.  Chaperone was present for exam Skin:    General: Skin is warm and dry.      Capillary Refill: Capillary refill takes less than 2 seconds.  Neurological:     General: No focal deficit present.     Mental Status: He is alert.  Psychiatric:        Mood and Affect: Mood normal.    ED Results / Procedures / Treatments   Labs (all labs ordered are listed, but only abnormal results are displayed) Labs Reviewed - No data to display  EKG None  Radiology No results found.  Procedures Procedures   Medications Ordered in ED Medications - No data to display  ED Course  I have reviewed the triage vital signs and the nursing notes.  Pertinent labs & imaging results that were available during my care of the patient were reviewed by me and considered in my medical decision making (see chart for details).    MDM Rules/Calculators/A&P                          Patient is a 60 year old male presenting to the emergency department for constipation.  He reportedly has not had a bowel movement in 7 days.  He tried an at home enema without success.  He also tried a medication which she does not member the name of but neither provided any relief.  On arrival to the emergency department patient is hypertensive but otherwise has normal vital signs.  Physical exam shows a distended abdomen with hyperactive bowel sounds.  Abdomen is nontender.  Differential includes constipation versus obstruction.  KUB ordered showing normal gas pattern with no significant stillborn.  Rectal exam showed no palpable stool in the rectal vault.  Patient was discharged home with a prescription for MiraLAX and strict return precautions.  Patient's vital signs are stable on discharge.  Final Clinical Impression(s) / ED Diagnoses Final diagnoses:  None    Rx / DC Orders ED Discharge Orders     None        Derrel Nip, MD 02/14/21 6962    Gwyneth Sprout, MD 02/14/21 [phone]

## 2021-02-14 NOTE — ED Notes (Signed)
States he has liquid stools now, is having flatus, states no abd distention, non-tender

## 2021-02-14 NOTE — ED Triage Notes (Signed)
States he has not a BM for the past 7 days

## 2022-02-10 ENCOUNTER — Emergency Department (HOSPITAL_BASED_OUTPATIENT_CLINIC_OR_DEPARTMENT_OTHER)
Admission: EM | Admit: 2022-02-10 | Discharge: 2022-02-10 | Discharge status: Against Medical Advice | Payer: No Typology Code available for payment source

## 2022-02-10 ENCOUNTER — Other Ambulatory Visit: Payer: Self-pay

## 2022-08-21 IMAGING — CR DG ABDOMEN 1V
2 series · 2 of 2 positions shown · non-contrast
Comparison: None.

CLINICAL DATA: Constipation.

EXAM:
ABDOMEN - 1 VIEW

[t abdomen supine (1 of 2)]
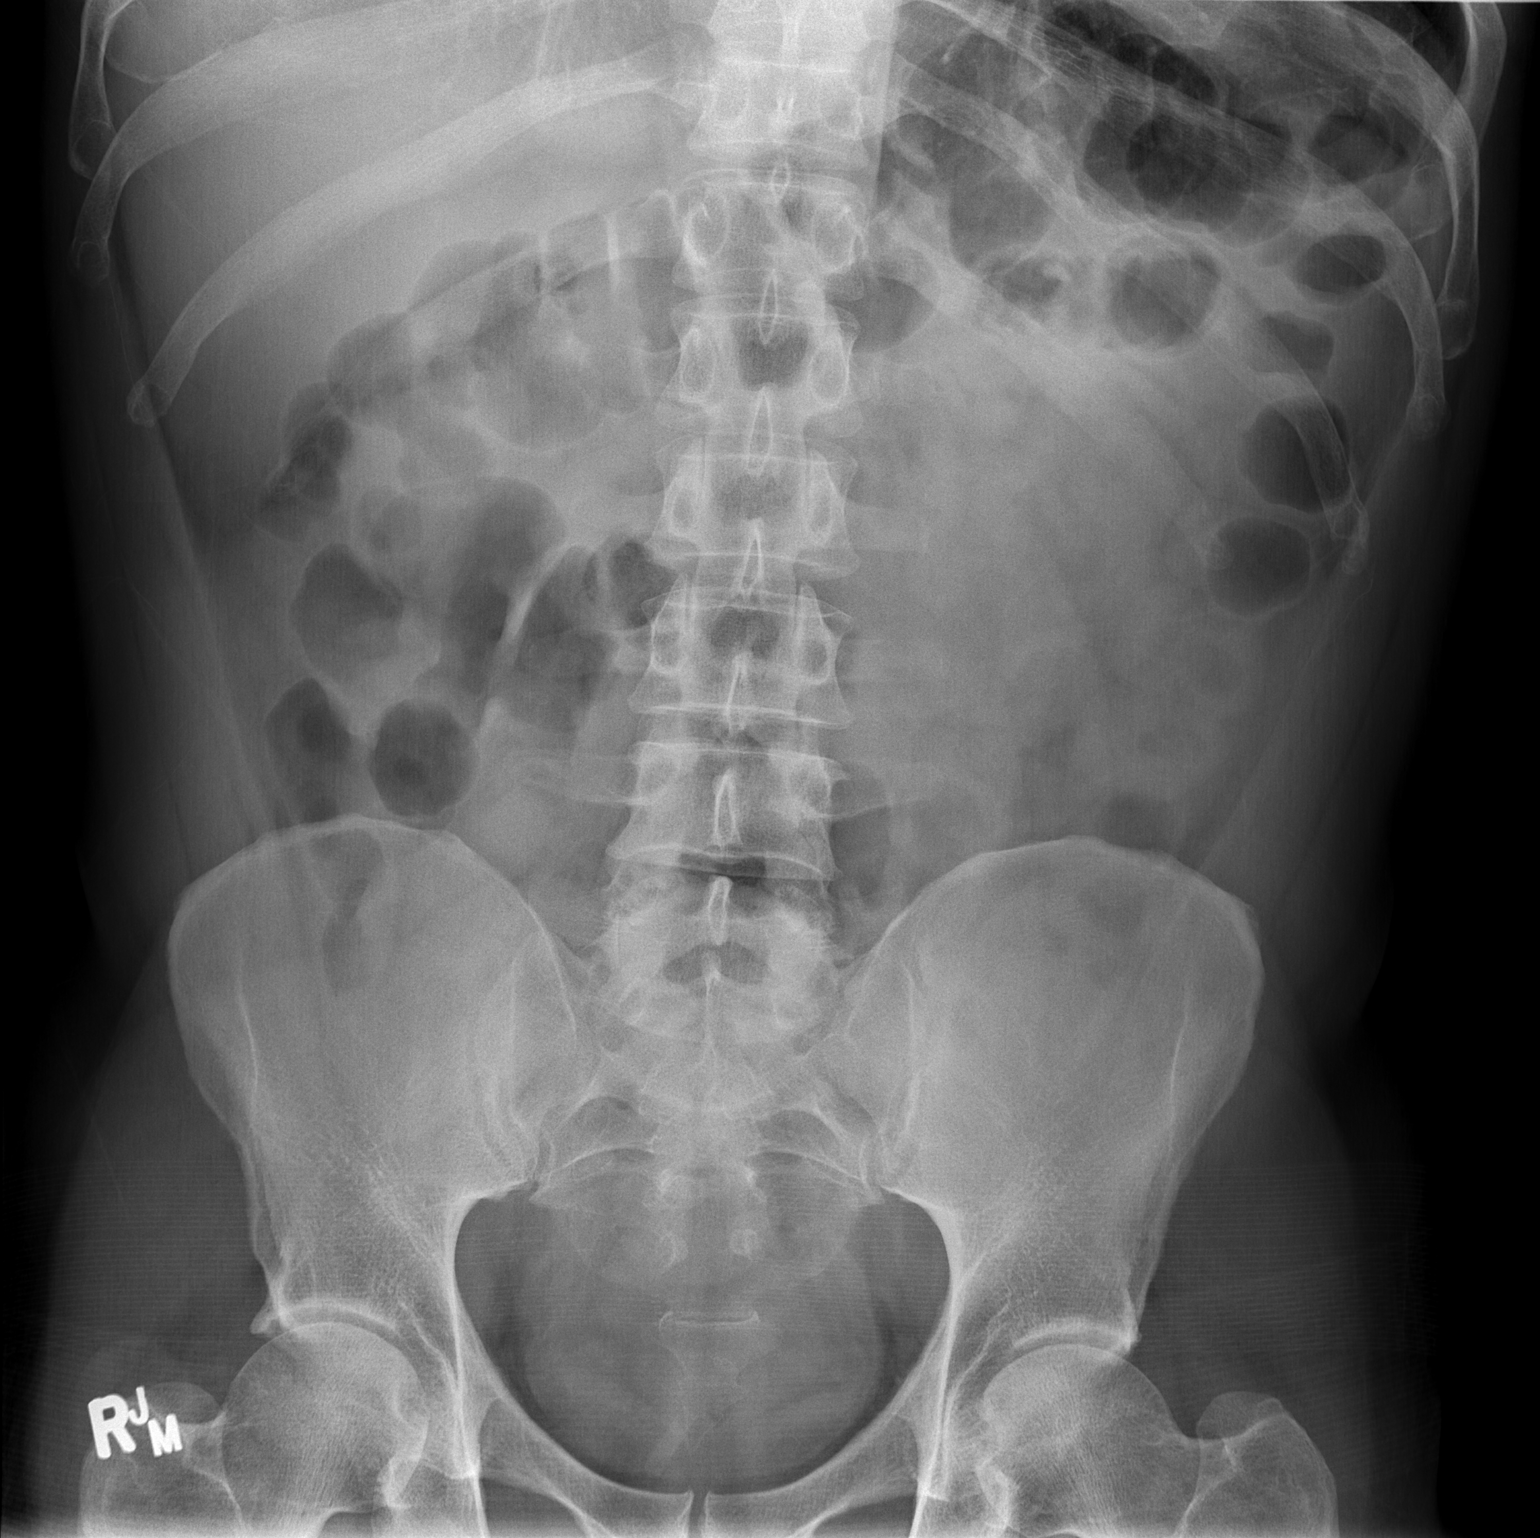

[t abdomen supine (2 of 2)]
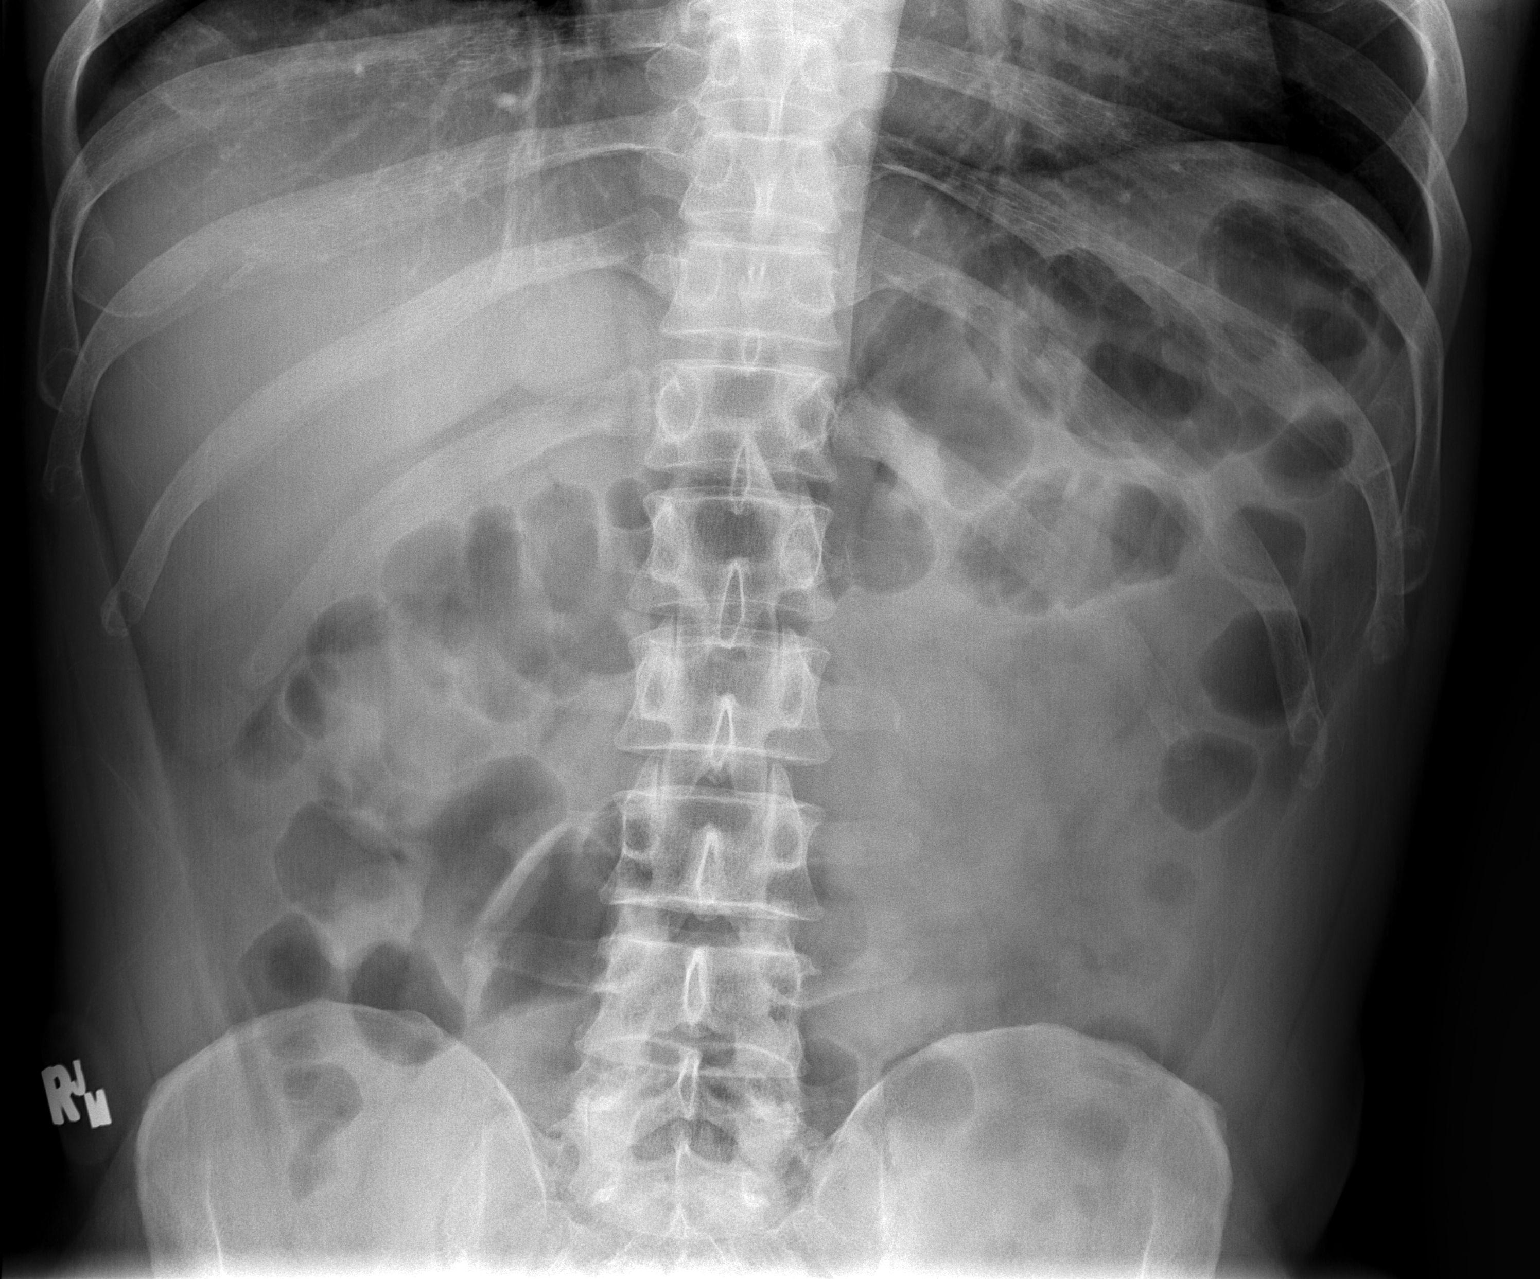

[2 of 2 positions shown; findings below may reference images not displayed]

FINDINGS: The bowel gas pattern is normal. No significant stool burden is
noted. No radio-opaque calculi or other significant radiographic
abnormality are seen.
IMPRESSION: Negative.
